# Patient Record
Sex: Female | Born: 1995 | Hispanic: No | Marital: Single | Smoking: Current some day smoker
Health system: Southern US, Community
[De-identification: ages and names within clinical notes are randomized; demographics above are authoritative.]

## PROBLEM LIST (undated history)

## (undated) HISTORY — PX: CHOLECYSTECTOMY: SHX55

---

## 2015-06-17 ENCOUNTER — Emergency Department (HOSPITAL_COMMUNITY): Payer: Medicaid Other

## 2015-06-17 ENCOUNTER — Inpatient Hospital Stay (HOSPITAL_COMMUNITY)
Admission: EM | Admit: 2015-06-17 | Discharge: 2015-06-20 | DRG: 392 | Disposition: A | Discharge status: Home / Self Care | Payer: Medicaid Other | Attending: Internal Medicine | Admitting: Internal Medicine

## 2015-06-17 ENCOUNTER — Encounter (HOSPITAL_COMMUNITY): Payer: Self-pay | Admitting: Emergency Medicine

## 2015-06-17 DIAGNOSIS — E876 Hypokalemia: Secondary | ICD-10-CM | POA: Diagnosis not present

## 2015-06-17 DIAGNOSIS — R109 Unspecified abdominal pain: Secondary | ICD-10-CM

## 2015-06-17 DIAGNOSIS — R7989 Other specified abnormal findings of blood chemistry: Secondary | ICD-10-CM | POA: Diagnosis present

## 2015-06-17 DIAGNOSIS — R05 Cough: Secondary | ICD-10-CM | POA: Diagnosis not present

## 2015-06-17 DIAGNOSIS — D72829 Elevated white blood cell count, unspecified: Secondary | ICD-10-CM | POA: Diagnosis present

## 2015-06-17 DIAGNOSIS — R1110 Vomiting, unspecified: Secondary | ICD-10-CM | POA: Diagnosis not present

## 2015-06-17 DIAGNOSIS — Z9049 Acquired absence of other specified parts of digestive tract: Secondary | ICD-10-CM

## 2015-06-17 DIAGNOSIS — R112 Nausea with vomiting, unspecified: Secondary | ICD-10-CM | POA: Diagnosis present

## 2015-06-17 DIAGNOSIS — E860 Dehydration: Secondary | ICD-10-CM | POA: Diagnosis present

## 2015-06-17 DIAGNOSIS — F17200 Nicotine dependence, unspecified, uncomplicated: Secondary | ICD-10-CM | POA: Diagnosis present

## 2015-06-17 DIAGNOSIS — R059 Cough, unspecified: Secondary | ICD-10-CM

## 2015-06-17 LAB — COMPREHENSIVE METABOLIC PANEL
ALT: 43 U/L (ref 14–54)
AST: 41 U/L (ref 15–41)
Albumin: 4.4 g/dL (ref 3.5–5.0)
Alkaline Phosphatase: 79 U/L (ref 38–126)
Anion gap: 12 (ref 5–15)
BUN: 35 mg/dL — AB (ref 6–20)
CHLORIDE: 104 mmol/L (ref 101–111)
CO2: 21 mmol/L — AB (ref 22–32)
CREATININE: 0.69 mg/dL (ref 0.44–1.00)
Calcium: 8.9 mg/dL (ref 8.9–10.3)
GFR calc Af Amer: >60 mL/min (ref >60)
GFR calc non Af Amer: >60 mL/min (ref >60)
GLUCOSE: 98 mg/dL (ref 65–99)
Potassium: 3 mmol/L — ABNORMAL LOW (ref 3.5–5.1)
SODIUM: 137 mmol/L (ref 135–145)
Total Bilirubin: 0.7 mg/dL (ref 0.3–1.2)
Total Protein: 8.5 g/dL — ABNORMAL HIGH (ref 6.5–8.1)

## 2015-06-17 LAB — URINALYSIS, ROUTINE W REFLEX MICROSCOPIC
BILIRUBIN URINE: NEGATIVE
GLUCOSE, UA: NEGATIVE mg/dL
Hgb urine dipstick: NEGATIVE
KETONES UR: 40 mg/dL — AB
Leukocytes, UA: NEGATIVE
Nitrite: NEGATIVE
PH: 6 (ref 5.0–8.0)
Protein, ur: NEGATIVE mg/dL
Specific Gravity, Urine: 1.02 (ref 1.005–1.030)
Urobilinogen, UA: 0.2 mg/dL (ref 0.0–1.0)

## 2015-06-17 LAB — RAPID URINE DRUG SCREEN, HOSP PERFORMED
AMPHETAMINES: NOT DETECTED
BENZODIAZEPINES: POSITIVE — AB
Barbiturates: NOT DETECTED
Cocaine: NOT DETECTED
OPIATES: NOT DETECTED
TETRAHYDROCANNABINOL: POSITIVE — AB

## 2015-06-17 LAB — CBC
HCT: 32 % — ABNORMAL LOW (ref 36.0–46.0)
Hemoglobin: 10.3 g/dL — ABNORMAL LOW (ref 12.0–15.0)
MCH: 28.5 pg (ref 26.0–34.0)
MCHC: 32.2 g/dL (ref 30.0–36.0)
MCV: 88.4 fL (ref 78.0–100.0)
PLATELETS: 455 10*3/uL — AB (ref 150–400)
RBC: 3.62 MIL/uL — ABNORMAL LOW (ref 3.87–5.11)
RDW: 16.4 % — AB (ref 11.5–15.5)
WBC: 10.6 10*3/uL — ABNORMAL HIGH (ref 4.0–10.5)

## 2015-06-17 LAB — LIPASE, BLOOD: LIPASE: 15 U/L (ref 11–51)

## 2015-06-17 LAB — I-STAT BETA HCG BLOOD, ED (MC, WL, AP ONLY): I-stat hCG, quantitative: <5 m[IU]/mL (ref <5)

## 2015-06-17 MED ORDER — PROMETHAZINE HCL 25 MG/ML IJ SOLN
6.2500 mg | Freq: Four times a day (QID) | INTRAMUSCULAR | Status: DC | PRN
  Administered 2015-06-17 – 2015-06-20 (×7): 6.25 mg via INTRAVENOUS
  Filled 2015-06-17 (×8): qty 1

## 2015-06-17 MED ORDER — HYDROMORPHONE HCL 1 MG/ML IJ SOLN
1.0000 mg | Freq: Once | INTRAMUSCULAR | Status: AC
  Administered 2015-06-17: 1 mg via INTRAVENOUS
  Filled 2015-06-17: qty 1

## 2015-06-17 MED ORDER — MORPHINE SULFATE (PF) 2 MG/ML IV SOLN
2.0000 mg | INTRAVENOUS | Status: AC | PRN
  Administered 2015-06-17 – 2015-06-18 (×2): 2 mg via INTRAVENOUS
  Filled 2015-06-17 (×2): qty 1

## 2015-06-17 MED ORDER — PROMETHAZINE HCL 25 MG/ML IJ SOLN: 6.2500 mg | Freq: Three times a day (TID) | INTRAMUSCULAR | Status: DC | PRN

## 2015-06-17 MED ORDER — SODIUM CHLORIDE 0.9 % IV BOLUS (SEPSIS)
1000.0000 mL | Freq: Once | INTRAVENOUS | Status: AC
  Administered 2015-06-17: 1000 mL via INTRAVENOUS

## 2015-06-17 MED ORDER — POTASSIUM CHLORIDE CRYS ER 20 MEQ PO TBCR
40.0000 meq | EXTENDED_RELEASE_TABLET | Freq: Once | ORAL | Status: DC
  Filled 2015-06-17: qty 2

## 2015-06-17 MED ORDER — IOHEXOL 300 MG/ML  SOLN
50.0000 mL | Freq: Once | INTRAMUSCULAR | Status: DC | PRN
  Administered 2015-06-17: 50 mL via ORAL

## 2015-06-17 MED ORDER — MORPHINE SULFATE (PF) 4 MG/ML IV SOLN
4.0000 mg | Freq: Once | INTRAVENOUS | Status: AC
  Administered 2015-06-17: 4 mg via INTRAVENOUS
  Filled 2015-06-17: qty 1

## 2015-06-17 MED ORDER — SODIUM CHLORIDE 0.9 % IV SOLN: INTRAVENOUS | Status: DC

## 2015-06-17 MED ORDER — PROMETHAZINE HCL 25 MG/ML IJ SOLN
25.0000 mg | Freq: Once | INTRAMUSCULAR | Status: AC
  Administered 2015-06-17: 25 mg via INTRAVENOUS
  Filled 2015-06-17: qty 1

## 2015-06-17 MED ORDER — FAMOTIDINE IN NACL 20-0.9 MG/50ML-% IV SOLN
20.0000 mg | Freq: Two times a day (BID) | INTRAVENOUS | Status: DC
  Administered 2015-06-17 – 2015-06-18 (×4): 20 mg via INTRAVENOUS
  Filled 2015-06-17 (×3): qty 50

## 2015-06-17 MED ORDER — IOHEXOL 300 MG/ML  SOLN
100.0000 mL | Freq: Once | INTRAMUSCULAR | Status: AC | PRN
  Administered 2015-06-17: 100 mL via INTRAVENOUS

## 2015-06-17 MED ORDER — ONDANSETRON HCL 4 MG/2ML IJ SOLN
4.0000 mg | Freq: Once | INTRAMUSCULAR | Status: AC
  Administered 2015-06-17: 4 mg via INTRAVENOUS
  Filled 2015-06-17: qty 2

## 2015-06-17 MED ORDER — FENTANYL CITRATE (PF) 100 MCG/2ML IJ SOLN
50.0000 ug | Freq: Once | INTRAMUSCULAR | Status: AC
  Administered 2015-06-17: 50 ug via INTRAVENOUS
  Filled 2015-06-17: qty 2

## 2015-06-17 MED ORDER — ENOXAPARIN SODIUM 40 MG/0.4ML ~~LOC~~ SOLN
40.0000 mg | SUBCUTANEOUS | Status: DC
  Administered 2015-06-17 – 2015-06-19 (×3): 40 mg via SUBCUTANEOUS
  Filled 2015-06-17 (×3): qty 0.4

## 2015-06-17 MED ORDER — POTASSIUM CHLORIDE IN NACL 20-0.9 MEQ/L-% IV SOLN
INTRAVENOUS | Status: DC
  Administered 2015-06-17 – 2015-06-19 (×4): via INTRAVENOUS
  Administered 2015-06-19: 1000 mL via INTRAVENOUS
  Filled 2015-06-17 (×9): qty 1000

## 2015-06-17 NOTE — ED Notes (Signed)
Bed: WA09 Expected date:  Expected time:  Means of arrival:  Comments: 19 yo F  N/V

## 2015-06-17 NOTE — ED Notes (Signed)
Brought in by EMS from home with c/o abdominal pain.  Pt reports that she has been having abdominal pain with N/V since 3 days ago, no diarrhea.  Pt was very "anxious" on EMS' arrival at the scene.  Was given Versed 2.5 mg IV en route.  Arrived to ED very drowsy/sleepy, responds to voice.

## 2015-06-17 NOTE — H&P (Signed)
History and Physical  Christine Hansen MRN:[id number] DOB: 07/17/1996 DOA: 06/17/2015  Referring physician: EDP PCP: PROVIDER NOT IN SYSTEM   Chief Complaint: n/v/ab pain  HPI: Christine Hansen is a 19 y.o. female   Who is from [place], [place] , here in town for a party this weekend, brought to [facility] ED by EMS due to n/v/abdominal pain, not able to keep any oral intake. She was given versed enroute due to "anxiety", in the ED, CT ab/pel no acute findings, UA no infection, negative pregnancy test, she was given ivf, due to not able to tolerate oral intake, hospitalist call to admit the patient. Patient denies sick contact, no fever, no chest pain, no sob.patient states this has been a chronic problems for the last two years, thought to be due to gallbladder issues but did not get better after cholecystectomy, recently had EGD by primary gastroenterologist.    Review of Systems:  Detail per HPI, Review of systems are otherwise negative  History reviewed. No pertinent past medical history. Past Surgical History  Procedure Laterality Date  . Cholecystectomy     Social History:  reports that she has been smoking.  She has never used smokeless tobacco. She reports that she drinks alcohol. She reports that she uses illicit drugs (Marijuana). Patient lives at home & is able to participate in activities of daily living independently   No Known Allergies  History reviewed. No pertinent family history.    Prior to Admission medications   Not on File    Physical Exam: BP 98/48 mmHg  Pulse 55  Temp(Src) 97.7 F (36.5 C) (Oral)  Resp 12  SpO2 100%  LMP 06/03/2015  General:  NAD Eyes: dry oral mucosa, PERRL ENT: unremarkable Neck: supple, no JVD Cardiovascular: RRR Respiratory: CTABL Abdomen: diffuse tender? But soft/ND, no guarding, no rebound, positive bowel sounds Skin: no rash Musculoskeletal:  No edema Psychiatric: calm/cooperative Neurologic: no focal findings            Labs on  Admission:  Basic Metabolic Panel:  Recent Labs Lab 06/17/15 0436  NA 137  K 3.0*  CL 104  CO2 21*  GLUCOSE 98  BUN 35*  CREATININE 0.69  CALCIUM 8.9   Liver Function Tests:  Recent Labs Lab 06/17/15 0436  AST 41  ALT 43  ALKPHOS 79  BILITOT 0.7  PROT 8.5*  ALBUMIN 4.4    Recent Labs Lab 06/17/15 0436  LIPASE 15   No results for input(s): AMMONIA in the last 168 hours. CBC:  Recent Labs Lab 06/17/15 0436  WBC 10.6*  HGB 10.3*  HCT 32.0*  MCV 88.4  PLT 455*   Cardiac Enzymes: No results for input(s): CKTOTAL, CKMB, CKMBINDEX, TROPONINI in the last 168 hours.  BNP (last 3 results) No results for input(s): BNP in the last 8760 hours.  ProBNP (last 3 results) No results for input(s): PROBNP in the last 8760 hours.  CBG: No results for input(s): GLUCAP in the last 168 hours.  Radiological Exams on Admission: Ct Abdomen Pelvis W Contrast  06/17/2015  CLINICAL DATA:  Diffuse abdominal pain, nausea and vomiting for 3 days. Negative serum pregnancy test. Prior cholecystectomy. EXAM: CT ABDOMEN AND PELVIS WITH CONTRAST TECHNIQUE: Multidetector CT imaging of the abdomen and pelvis was performed using the standard protocol following bolus administration of intravenous contrast. CONTRAST:  <[id number]<[id number]<[id number]<[id number]<[id number]<[id number] <[phone]<[phone]<[phone]<[phone]<MEASUREMENTA09.6La732 097 4[facility] [id number] IOHEXOL 300 MG/ML  SOLN COMPARISON:  None. FINDINGS: Lower chest: No significant pulmonary nodules or acute consolidative airspace disease. Hepatobiliary: Normal liver with no liver  mass. Status post cholecystectomy. No biliary ductal dilatation. Pancreas: Normal, with no mass or duct dilation. Spleen: Normal size. No mass. Adrenals/Urinary Tract: Normal adrenals. Normal kidneys with no hydronephrosis and no renal mass. Normal bladder. Stomach/Bowel: Grossly normal stomach. Normal caliber small bowel with no small bowel wall thickening. Normal appendix. Normal large bowel with no diverticulosis, large bowel wall thickening or pericolonic fat stranding.  Vascular/Lymphatic: Normal caliber abdominal aorta. Patent portal, splenic and renal veins. No pathologically enlarged lymph nodes in the abdomen or pelvis. Reproductive: Grossly normal anteverted uterus.  No adnexal mass. Other: No pneumoperitoneum, ascites or focal fluid collection. Musculoskeletal: No aggressive appearing focal osseous lesions. IMPRESSION: 1. No acute abnormality in the abdomen or pelvis. No evidence of bowel obstruction or acute bowel inflammation. Normal appendix. 2. Status post cholecystectomy.  No biliary ductal dilatation. Electronically Signed   By: Delbert Phenix M.D.   On: 06/17/2015 09:30      Assessment/Plan Present on Admission:  . Intractable vomiting . N&V (nausea and vomiting)  N/V/abdominal pain:  CT ab/pel no acute findings, urine pregnancy test negative, urine tox screen +THC. marijuana induced hyperemesis? Supportive care with ivf,  antiemetics and analgesics. Diet: Clears for now.  Hypokalemia: replace k, check mag.   Mild azotemia, mild leukocytosis: likely from dehydration, continue ivf.    DVT prophylaxis: lovenox  Consultants: none  Code Status: full   Family Communication:  Patient   Disposition Plan: admit to med surg observation, anticipate discharge in 24-48hrs if patient able to tolerate oral intake  Time spent:  Marquon Alcala MD, PhD [facility] Pager [phone] If 7PM-7AM, please contact night-coverage at [email or web address], password [facility]

## 2015-06-17 NOTE — ED Provider Notes (Signed)
CSN: [id number]     Arrival date & time 06/17/15  0407 History   First MD Initiated Contact with Patient 06/17/15 0701     Chief Complaint  Patient presents with  . Abdominal Pain     (Consider location/radiation/quality/duration/timing/severity/associated sxs/prior Treatment) HPI  19 year old female presents with nominal pain, nausea, and vomiting since 3 days ago. Denies diarrhea. Patient is asleep upon my arrival. She was given 2.5 mg IV Versed a couple hours ago because EMS states she was "very anxious" on scene. She has been having subjective fevers and chills. She states the abdominal pain is all over, cannot localize. Is also having mid back pain, also having a hard time localizing. Denies any urinary symptoms. Denies any blood in her vomit. States the pain is always there but anytime she throws up her abdominal pain worsens.   History reviewed. No pertinent past medical history. Past Surgical History  Procedure Laterality Date  . Cholecystectomy     History reviewed. No pertinent family history. Social History  Substance Use Topics  . Smoking status: Current Some Day Smoker  . Smokeless tobacco: Never Used  . Alcohol Use: Yes   OB History    No data available     Review of Systems  Constitutional: Positive for fever and chills.  Gastrointestinal: Positive for nausea, vomiting and abdominal pain. Negative for diarrhea and constipation.  Genitourinary: Negative for dysuria, vaginal bleeding, vaginal discharge and menstrual problem.  Musculoskeletal: Positive for back pain.  All other systems reviewed and are negative.     Allergies  Review of patient's allergies indicates no known allergies.  Home Medications   Prior to Admission medications   Not on File   BP 164/83 mmHg  Pulse 67  Temp(Src) 98.1 F (36.7 C) (Oral)  Resp 18  SpO2 100% Physical Exam  Constitutional: She is oriented to person, place, and time. She appears well-developed and well-nourished.  No distress.  HENT:  Head: Normocephalic and atraumatic.  Right Ear: External ear normal.  Left Ear: External ear normal.  Nose: Nose normal.  Eyes: Right eye exhibits no discharge. Left eye exhibits no discharge.  Cardiovascular: Normal rate, regular rhythm and normal heart sounds.   Pulmonary/Chest: Effort normal and breath sounds normal.  Abdominal: Soft. There is tenderness.  Tenderness diffusely during exam, exaggerated response to mild palpation. Abdomen soft, no rebound. No guarding. +CVA tenderness bilaterally  Neurological: She is alert and oriented to person, place, and time.  Skin: Skin is warm and dry. She is not diaphoretic.  Nursing note and vitals reviewed.   ED Course  Procedures (including critical care time) Labs Review Labs Reviewed  COMPREHENSIVE METABOLIC PANEL - Abnormal; Notable for the following:    Potassium 3.0 (*)    CO2 21 (*)    BUN 35 (*)    Total Protein 8.5 (*)    All other components within normal limits  CBC - Abnormal; Notable for the following:    WBC 10.6 (*)    RBC 3.62 (*)    Hemoglobin 10.3 (*)    HCT 32.0 (*)    RDW 16.4 (*)    Platelets 455 (*)    All other components within normal limits  LIPASE, BLOOD  URINALYSIS, ROUTINE W REFLEX MICROSCOPIC (NOT AT [facility])  I-STAT BETA HCG BLOOD, ED ([facility], [facility], [facility] ONLY)    Imaging Review Ct Abdomen Pelvis W Contrast  06/17/2015  CLINICAL DATA:  Diffuse abdominal pain, nausea and vomiting for 3 days. Negative serum pregnancy  test. Prior cholecystectomy. EXAM: CT ABDOMEN AND PELVIS WITH CONTRAST TECHNIQUE: Multidetector CT imaging of the abdomen and pelvis was performed using the standard protocol following bolus administration of intravenous contrast. CONTRAST:  100mL OMNIPAQUE IOHEXOL 300 MG/ML  SOLN COMPARISON:  None. FINDINGS: Lower chest: No significant pulmonary nodules or acute consolidative airspace disease. Hepatobiliary: Normal liver with no liver mass. Status post cholecystectomy. No biliary  ductal dilatation. Pancreas: Normal, with no mass or duct dilation. Spleen: Normal size. No mass. Adrenals/Urinary Tract: Normal adrenals. Normal kidneys with no hydronephrosis and no renal mass. Normal bladder. Stomach/Bowel: Grossly normal stomach. Normal caliber small bowel with no small bowel wall thickening. Normal appendix. Normal large bowel with no diverticulosis, large bowel wall thickening or pericolonic fat stranding. Vascular/Lymphatic: Normal caliber abdominal aorta. Patent portal, splenic and renal veins. No pathologically enlarged lymph nodes in the abdomen or pelvis. Reproductive: Grossly normal anteverted uterus.  No adnexal mass. Other: No pneumoperitoneum, ascites or focal fluid collection. Musculoskeletal: No aggressive appearing focal osseous lesions. IMPRESSION: 1. No acute abnormality in the abdomen or pelvis. No evidence of bowel obstruction or acute bowel inflammation. Normal appendix. 2. Status post cholecystectomy.  No biliary ductal dilatation. Electronically Signed   By: Delbert PhenixJason A Poff M.D.   On: 06/17/2015 09:30   I have personally reviewed and evaluated these images and lab results as part of my medical decision-making.   EKG Interpretation None      MDM   Final diagnoses:  None  Intractable Nausea and Vomiting  Patient with intractable nausea and vomiting. The abdominal pain is most likely related to the amount of vomiting, as there is no focal source on her CT scan. No GU or vaginal symptoms. Unable to control her emesis and she is unable to tolerate oral fluids and thus she will be admitted for observation for continued hydration and symptom control.    Pricilla LovelessScott Lulie Hurd, MD 06/18/15 [phone]

## 2015-06-18 ENCOUNTER — Observation Stay (HOSPITAL_COMMUNITY): Payer: Medicaid Other

## 2015-06-18 DIAGNOSIS — D72829 Elevated white blood cell count, unspecified: Secondary | ICD-10-CM | POA: Diagnosis present

## 2015-06-18 DIAGNOSIS — F17200 Nicotine dependence, unspecified, uncomplicated: Secondary | ICD-10-CM | POA: Diagnosis present

## 2015-06-18 DIAGNOSIS — R112 Nausea with vomiting, unspecified: Secondary | ICD-10-CM | POA: Diagnosis present

## 2015-06-18 DIAGNOSIS — Z9049 Acquired absence of other specified parts of digestive tract: Secondary | ICD-10-CM | POA: Diagnosis not present

## 2015-06-18 DIAGNOSIS — R05 Cough: Secondary | ICD-10-CM

## 2015-06-18 DIAGNOSIS — E876 Hypokalemia: Secondary | ICD-10-CM | POA: Diagnosis present

## 2015-06-18 DIAGNOSIS — E860 Dehydration: Secondary | ICD-10-CM | POA: Diagnosis present

## 2015-06-18 DIAGNOSIS — R109 Unspecified abdominal pain: Secondary | ICD-10-CM | POA: Diagnosis not present

## 2015-06-18 DIAGNOSIS — R1110 Vomiting, unspecified: Secondary | ICD-10-CM | POA: Diagnosis not present

## 2015-06-18 DIAGNOSIS — R7989 Other specified abnormal findings of blood chemistry: Secondary | ICD-10-CM | POA: Diagnosis present

## 2015-06-18 LAB — BASIC METABOLIC PANEL
Anion gap: 9 (ref 5–15)
BUN: 22 mg/dL — ABNORMAL HIGH (ref 6–20)
CHLORIDE: 109 mmol/L (ref 101–111)
CO2: 22 mmol/L (ref 22–32)
Calcium: 8.6 mg/dL — ABNORMAL LOW (ref 8.9–10.3)
Creatinine, Ser: 0.67 mg/dL (ref 0.44–1.00)
GFR calc non Af Amer: >60 mL/min (ref >60)
Glucose, Bld: 70 mg/dL (ref 65–99)
POTASSIUM: 3.3 mmol/L — AB (ref 3.5–5.1)
SODIUM: 140 mmol/L (ref 135–145)

## 2015-06-18 LAB — CBC
HCT: 31.1 % — ABNORMAL LOW (ref 36.0–46.0)
HEMOGLOBIN: 9.6 g/dL — AB (ref 12.0–15.0)
MCH: 28.9 pg (ref 26.0–34.0)
MCHC: 30.9 g/dL (ref 30.0–36.0)
MCV: 93.7 fL (ref 78.0–100.0)
Platelets: 395 10*3/uL (ref 150–400)
RBC: 3.32 MIL/uL — AB (ref 3.87–5.11)
RDW: 17 % — ABNORMAL HIGH (ref 11.5–15.5)
WBC: 12.3 10*3/uL — ABNORMAL HIGH (ref 4.0–10.5)

## 2015-06-18 LAB — INFLUENZA PANEL BY PCR (TYPE A & B)
H1N1FLUPCR: NOT DETECTED
INFLAPCR: NEGATIVE
Influenza B By PCR: NEGATIVE

## 2015-06-18 LAB — MAGNESIUM: MAGNESIUM: 1.8 mg/dL (ref 1.7–2.4)

## 2015-06-18 MED ORDER — BOOST / RESOURCE BREEZE PO LIQD
1.0000 | Freq: Two times a day (BID) | ORAL | Status: DC
  Administered 2015-06-18 – 2015-06-20 (×5): 1 via ORAL

## 2015-06-18 MED ORDER — MORPHINE SULFATE (PF) 2 MG/ML IV SOLN
2.0000 mg | INTRAVENOUS | Status: AC | PRN
  Administered 2015-06-18 – 2015-06-19 (×2): 2 mg via INTRAVENOUS
  Filled 2015-06-18 (×2): qty 1

## 2015-06-18 MED ORDER — PROMETHAZINE HCL 25 MG/ML IJ SOLN
12.5000 mg | Freq: Once | INTRAMUSCULAR | Status: AC
  Administered 2015-06-18: 12.5 mg via INTRAVENOUS
  Filled 2015-06-18: qty 1

## 2015-06-18 MED ORDER — ADULT MULTIVITAMIN W/MINERALS CH
1.0000 | ORAL_TABLET | Freq: Every day | ORAL | Status: DC
  Administered 2015-06-18 – 2015-06-20 (×3): 1 via ORAL
  Filled 2015-06-18 (×3): qty 1

## 2015-06-18 MED ORDER — GUAIFENESIN ER 600 MG PO TB12
600.0000 mg | ORAL_TABLET | Freq: Two times a day (BID) | ORAL | Status: DC
  Administered 2015-06-18 – 2015-06-20 (×5): 600 mg via ORAL
  Filled 2015-06-18 (×5): qty 1

## 2015-06-18 MED ORDER — HYDROCODONE-ACETAMINOPHEN 5-325 MG PO TABS
1.0000 | ORAL_TABLET | Freq: Three times a day (TID) | ORAL | Status: DC | PRN
  Administered 2015-06-18 – 2015-06-20 (×4): 1 via ORAL
  Filled 2015-06-18 (×5): qty 1

## 2015-06-18 MED ORDER — BENZONATATE 100 MG PO CAPS
100.0000 mg | ORAL_CAPSULE | Freq: Three times a day (TID) | ORAL | Status: DC | PRN
  Administered 2015-06-18: 100 mg via ORAL
  Filled 2015-06-18: qty 1

## 2015-06-18 NOTE — Progress Notes (Signed)
   PROGRESS NOTE  Hartford Polirin Bangerter WUJ:[id number] DOB: 07-11-1996 DOA: 06/17/2015 PCP: PROVIDER NOT IN SYSTEM  HPI/Recap of past 24 hours:  Coughing spells, reported no more vomiting,   Assessment/Plan: Active Problems:   Intractable vomiting   N&V (nausea and vomiting)  N/V/abdominal pain:  CT ab/pel no acute findings, urine pregnancy test negative, urine tox screen +THC. marijuana induced hyperemesis? Supportive care with ivf, antiemetics and analgesics. Advance diet as tolerated  Hypokalemia: replace k, check mag.   Mild azotemia, mild leukocytosis: likely from dehydration, continue ivf.  Cough, cxr pending, flu swab.  DVT prophylaxis: lovenox  Consultants: none  Code Status: full   Family Communication: Patient  Disposition Plan: home likely in 1-2 days   Consultants:  none  Procedures:  none  Antibiotics:  none   Objective: BP 92/44 mmHg  Pulse 60  Temp(Src) 98.1 F (36.7 C) (Oral)  Resp 14  SpO2 98%  LMP 06/03/2015  Intake/Output Summary (Last 24 hours) at 06/18/15 1003 Last data filed at 06/18/15 0600  Gross per 24 hour  Intake 1521.67 ml  Output      0 ml  Net 1521.67 ml   There were no vitals filed for this visit.  Exam:   General:  Does not look comfortable  Cardiovascular: RRR  Respiratory: CTABL  Abdomen: Soft/ND/NT, positive BS  Musculoskeletal: No Edema  Neuro: aaox3  Data Reviewed: Basic Metabolic Panel:  Recent Labs Lab 06/17/15 0436 06/18/15 0420  NA 137 140  K 3.0* 3.3*  CL 104 109  CO2 21* 22  GLUCOSE 98 70  BUN 35* 22*  CREATININE 0.69 0.67  CALCIUM 8.9 8.6*  MG  --  1.8   Liver Function Tests:  Recent Labs Lab 06/17/15 0436  AST 41  ALT 43  ALKPHOS 79  BILITOT 0.7  PROT 8.5*  ALBUMIN 4.4    Recent Labs Lab 06/17/15 0436  LIPASE 15   No results for input(s): AMMONIA in the last 168 hours. CBC:  Recent Labs Lab 06/17/15 0436 06/18/15 0420  WBC 10.6* 12.3*  HGB 10.3* 9.6*  HCT  32.0* 31.1*  MCV 88.4 93.7  PLT 455* 395   Cardiac Enzymes:   No results for input(s): CKTOTAL, CKMB, CKMBINDEX, TROPONINI in the last 168 hours. BNP (last 3 results) No results for input(s): BNP in the last 8760 hours.  ProBNP (last 3 results) No results for input(s): PROBNP in the last 8760 hours.  CBG: No results for input(s): GLUCAP in the last 168 hours.  No results found for this or any previous visit (from the past 240 hour(s)).   Studies: No results found.  Scheduled Meds: . enoxaparin (LOVENOX) injection  40 mg Subcutaneous Q24H  . famotidine (PEPCID) IV  20 mg Intravenous Q12H  . guaiFENesin  600 mg Oral BID  . potassium chloride SA  40 mEq Oral Once    Continuous Infusions: . 0.9 % NaCl with KCl 20 mEq / L 100 mL/hr at 06/18/15 0300     Time spent: 25mins  Jamie Hafford MD, PhD  [facility] Pager [phone]. If 7PM-7AM, please contact night-coverage at [email or web address], password [email or web address] 06/18/2015, 10:03 AM

## 2015-06-18 NOTE — Progress Notes (Signed)
Initial Nutrition Assessment  DOCUMENTATION CODES:   Not applicable  INTERVENTION:  - Please provide Boost Breeze BID (each supplement contains 250 kcal and 9 gm protein).  - Provide Multivitamin.  - RD team to continue to monitor for needs and diet advancement.    NUTRITION DIAGNOSIS:   Inadequate oral intake related to acute illness as evidenced by estimated needs.   GOAL:   Patient will meet greater than or equal to 90% of their needs   MONITOR:   PO intake, Supplement acceptance, Diet advancement, Labs, Weight trends  REASON FOR ASSESSMENT:   Malnutrition Screening Tool    ASSESSMENT:   19 [place] here in [place] for a party this weekend, [facility] due to n/v/abdominal pain, not able to keep any oral intake. She was given versed enroute due to "anxiety", in the ED, CT ab/pel no acute findings, UA no infection, negative pregnancy test, she was given ivf, due to not able to tolerate oral intake, hospitalist call to admit the patient.  Patient is coughing and curled up in bed during visit for MST. Patient reports positive for abdominal pain, and constipation. She denies nausea, and vomiting. Patient only able to provide minimal historical data during visit due to excessive coughing. Minimal anthropometric data available in chart, but working with nurse to update.   Patient's diet was recently advanced to a full liquid diet, but per nurse, the patient has not yet ordered anything.  She reports a low appetite for the past 3-4 days. Her last normal meal was last Wednesday. Patient noted that her clothes have started to fit differently, and they are too big to wear.   Patient lives with uncle and does most of the cooking at home. She reports a usual breakfast is cereal or a bagel. Not able to collect full diet history.   Comprehensive NFPE - not able to be performed at this visit, recommend completion at next visit. Mild fat wasting observed -  orbital. Mild muscle wasting - temple.   Labs reviewed: high WBC (12.3), low RBC (2.32), low Heme (9.6), low K (3.3), high BUN (22), low Ca (8.6)  Medications reviewed.    Diet Order:  Diet full liquid Room service appropriate?: Yes; Fluid consistency:: Thin  Skin:  Reviewed, no issues  Last BM:  10/29  Height:   Ht Readings from Last 1 Encounters:  06/18/15 5\' 4"  (1.626 m) (45 %*, Z = -0.11)   * Growth percentiles are based on CDC 2-20 Years data.    Weight:   Wt Readings from Last 1 Encounters:  No data found for Wt    Ideal Body Weight:  54.5 kg  BMI:  There is no weight on file to calculate BMI.  Estimated Nutritional Needs:   Kcal:  1300-1500 kcal  Protein:  60-70 gm  Fluid:  2 L/day  EDUCATION NEEDS:   Education needs no appropriate at this time  Delano MetzMaggie Hashim Eichhorst, Dietetic Intern 06/18/2015 1:52 PM

## 2015-06-19 LAB — BASIC METABOLIC PANEL
ANION GAP: 4 — AB (ref 5–15)
BUN: 9 mg/dL (ref 6–20)
CO2: 26 mmol/L (ref 22–32)
Calcium: 8.2 mg/dL — ABNORMAL LOW (ref 8.9–10.3)
Chloride: 108 mmol/L (ref 101–111)
Creatinine, Ser: 0.57 mg/dL (ref 0.44–1.00)
GFR calc Af Amer: >60 mL/min (ref >60)
GFR calc non Af Amer: >60 mL/min (ref >60)
GLUCOSE: 91 mg/dL (ref 65–99)
POTASSIUM: 2.9 mmol/L — AB (ref 3.5–5.1)
Sodium: 138 mmol/L (ref 135–145)

## 2015-06-19 LAB — CBC WITH DIFFERENTIAL/PLATELET
BASOS ABS: 0 10*3/uL (ref 0.0–0.1)
Basophils Relative: 0 %
Eosinophils Absolute: 0.1 10*3/uL (ref 0.0–0.7)
Eosinophils Relative: 1 %
HEMATOCRIT: 30.6 % — AB (ref 36.0–46.0)
Hemoglobin: 9.5 g/dL — ABNORMAL LOW (ref 12.0–15.0)
LYMPHS ABS: 3.5 10*3/uL (ref 0.7–4.0)
LYMPHS PCT: 46 %
MCH: 28.4 pg (ref 26.0–34.0)
MCHC: 31 g/dL (ref 30.0–36.0)
MCV: 91.6 fL (ref 78.0–100.0)
MONO ABS: 0.6 10*3/uL (ref 0.1–1.0)
Monocytes Relative: 8 %
NEUTROS ABS: 3.4 10*3/uL (ref 1.7–7.7)
Neutrophils Relative %: 45 %
Platelets: 308 10*3/uL (ref 150–400)
RBC: 3.34 MIL/uL — AB (ref 3.87–5.11)
RDW: 16.3 % — ABNORMAL HIGH (ref 11.5–15.5)
WBC: 7.5 10*3/uL (ref 4.0–10.5)

## 2015-06-19 LAB — MAGNESIUM: Magnesium: 1.8 mg/dL (ref 1.7–2.4)

## 2015-06-19 LAB — CORTISOL: Cortisol, Plasma: 4.5 ug/dL

## 2015-06-19 LAB — TSH: TSH: 1.02 u[IU]/mL (ref 0.350–4.500)

## 2015-06-19 MED ORDER — MAGNESIUM SULFATE 2 GM/50ML IV SOLN
2.0000 g | Freq: Once | INTRAVENOUS | Status: AC
  Administered 2015-06-19: 2 g via INTRAVENOUS
  Filled 2015-06-19: qty 50

## 2015-06-19 MED ORDER — POTASSIUM CHLORIDE CRYS ER 20 MEQ PO TBCR
40.0000 meq | EXTENDED_RELEASE_TABLET | ORAL | Status: AC
Start: 2015-06-19 — End: 2015-06-19
  Administered 2015-06-19 (×2): 40 meq via ORAL
  Filled 2015-06-19 (×2): qty 2

## 2015-06-19 MED ORDER — FAMOTIDINE 20 MG PO TABS
20.0000 mg | ORAL_TABLET | Freq: Two times a day (BID) | ORAL | Status: DC
  Administered 2015-06-19 – 2015-06-20 (×3): 20 mg via ORAL
  Filled 2015-06-19 (×4): qty 1

## 2015-06-19 NOTE — Progress Notes (Signed)
Key Points: Use following P&T approved IV to PO non-antibiotic change policy.  Description contains the criteria that are approved Note: Policy Excludes:  Esophagectomy patientsPHARMACIST - PHYSICIAN COMMUNICATION [facility] CONCERNING: IV to Oral Route Change Policy  RECOMMENDATION: This patient is receiving Pepcid by the intravenous route.  Based on criteria approved by the Pharmacy and Therapeutics Committee, the intravenous medication(s) is/are being converted to the equivalent oral dose form(s).   DESCRIPTION: These criteria include:  The patient is eating (either orally or via tube) and/or has been taking other orally administered medications for a least 24 hours  The patient has no evidence of active gastrointestinal bleeding or impaired GI absorption (gastrectomy, short bowel, patient on TNA or NPO).  If you have questions about this conversion, please contact the Pharmacy Department  []   [phone]( [phone] )  [facility] []   [phone]( [phone] )  [facility]  []   [phone]( [phone] )  [facility] [x]   [phone]( [phone] )  [facility]  Earl ManyLegge, Sakari Alkhatib South RoxanaMarshall, Richard L. Roudebush Va Medical CenterRPH 06/19/2015 8:14 AM

## 2015-06-19 NOTE — Progress Notes (Signed)
PROGRESS NOTE  Christine Hansen ZOX:[id number] DOB: 1995/11/17 DOA: 06/17/2015 PCP: PROVIDER NOT IN SYSTEM  HPI/Recap of past 24 hours:  Feeing better, want to advance diet  Assessment/Plan: Active Problems:   Intractable vomiting   N&V (nausea and vomiting)  N/V/abdominal pain:  CT ab/pel no acute findings, urine pregnancy test negative, urine tox screen +THC. marijuana induced hyperemesis? Supportive care with ivf, antiemetics and analgesics. Advance diet as tolerated Better, diet advance to soft, continue ivf, anticipate d/c tomorrow on 11/3.  Hypokalemia/hypomagnesemia: continue replace k/ mag.  Mild azotemia, mild leukocytosis: resolved with hydration. continue ivf.  Cough, cxr no acute findings, flu swab negative, mucinex and benzonatate prn  DVT prophylaxis: lovenox  Consultants: none  Code Status: full   Family Communication: Patient  Disposition Plan: home on 11/3   Consultants:  none  Procedures:  none  Antibiotics:  none   Objective: BP 91/45 mmHg  Pulse 67  Temp(Src) 97.3 F (36.3 C) (Oral)  Resp 16  Ht  (1.626 m)  SpO2 100%  LMP 06/17/2015  Intake/Output Summary (Last 24 hours) at 06/19/15 1425 Last data filed at 06/19/15 1407  Gross per 24 hour  Intake 2978.33 ml  Output      0 ml  Net 2978.33 ml   There were no vitals filed for this visit.  Exam:   General:  Better, not in pain, NAD  Cardiovascular: RRR  Respiratory: CTABL  Abdomen: Soft/ND/NT, positive BS  Musculoskeletal: No Edema  Neuro: aaox3  Data Reviewed: Basic Metabolic Panel:  Recent Labs Lab 06/17/15 0436 06/18/15 0420 06/19/15 0400  NA 137 140 138  K 3.0* 3.3* 2.9*  CL 104 109 108  CO2 21* 22 26  GLUCOSE 98 70 91  BUN 35* 22* 9  CREATININE 0.69 0.67 0.57  CALCIUM 8.9 8.6* 8.2*  MG  --  1.8 1.8   Liver Function Tests:  Recent Labs Lab 06/17/15 0436  AST 41  ALT 43  ALKPHOS 79  BILITOT 0.7  PROT 8.5*  ALBUMIN 4.4    Recent  Labs Lab 06/17/15 0436  LIPASE 15   No results for input(s): AMMONIA in the last 168 hours. CBC:  Recent Labs Lab 06/17/15 0436 06/18/15 0420 06/19/15 0400  WBC 10.6* 12.3* 7.5  NEUTROABS  --   --  3.4  HGB 10.3* 9.6* 9.5*  HCT 32.0* 31.1* 30.6*  MCV 88.4 93.7 91.6  PLT 455* 395 308   Cardiac Enzymes:   No results for input(s): CKTOTAL, CKMB, CKMBINDEX, TROPONINI in the last 168 hours. BNP (last 3 results) No results for input(s): BNP in the last 8760 hours.  ProBNP (last 3 results) No results for input(s): PROBNP in the last 8760 hours.  CBG: No results for input(s): GLUCAP in the last 168 hours.  No results found for this or any previous visit (from the past 240 hour(s)).   Studies: No results found.  Scheduled Meds: . enoxaparin (LOVENOX) injection  40 mg Subcutaneous Q24H  . famotidine  20 mg Oral BID  . feeding supplement  1 Container Oral BID BM  . guaiFENesin  600 mg Oral BID  . multivitamin with minerals  1 tablet Oral Daily  . potassium chloride SA  40 mEq Oral Once    Continuous Infusions: . 0.9 % NaCl with KCl 20 mEq / L 1,000 mL (06/19/15 1037)     Time spent:  Marycruz Boehner MD, PhD  [facility] Pager [phone]. If 7PM-7AM, please contact night-coverage at [email or web address], password  [email or web address] 06/19/2015, 2:25 PM  LOS: 1 day

## 2015-06-20 DIAGNOSIS — F1220 Cannabis dependence, uncomplicated: Secondary | ICD-10-CM

## 2015-06-20 DIAGNOSIS — R7989 Other specified abnormal findings of blood chemistry: Secondary | ICD-10-CM

## 2015-06-20 LAB — COMPREHENSIVE METABOLIC PANEL
ALBUMIN: 3.3 g/dL — AB (ref 3.5–5.0)
ALT: 21 U/L (ref 14–54)
ANION GAP: 6 (ref 5–15)
AST: 18 U/L (ref 15–41)
Alkaline Phosphatase: 62 U/L (ref 38–126)
BILIRUBIN TOTAL: 0.2 mg/dL — AB (ref 0.3–1.2)
BUN: 10 mg/dL (ref 6–20)
CO2: 24 mmol/L (ref 22–32)
Calcium: 8.8 mg/dL — ABNORMAL LOW (ref 8.9–10.3)
Chloride: 110 mmol/L (ref 101–111)
Creatinine, Ser: 0.53 mg/dL (ref 0.44–1.00)
GFR calc non Af Amer: >60 mL/min (ref >60)
GLUCOSE: 82 mg/dL (ref 65–99)
POTASSIUM: 3.8 mmol/L (ref 3.5–5.1)
SODIUM: 140 mmol/L (ref 135–145)
TOTAL PROTEIN: 6.2 g/dL — AB (ref 6.5–8.1)

## 2015-06-20 LAB — CBC
HEMATOCRIT: 29.1 % — AB (ref 36.0–46.0)
Hemoglobin: 9.1 g/dL — ABNORMAL LOW (ref 12.0–15.0)
MCH: 28.9 pg (ref 26.0–34.0)
MCHC: 31.3 g/dL (ref 30.0–36.0)
MCV: 92.4 fL (ref 78.0–100.0)
Platelets: 323 10*3/uL (ref 150–400)
RBC: 3.15 MIL/uL — ABNORMAL LOW (ref 3.87–5.11)
RDW: 16.3 % — AB (ref 11.5–15.5)
WBC: 8.5 10*3/uL (ref 4.0–10.5)

## 2015-06-20 LAB — MAGNESIUM: Magnesium: 1.9 mg/dL (ref 1.7–2.4)

## 2015-06-20 MED ORDER — HYDROCODONE-ACETAMINOPHEN 5-325 MG PO TABS: 2.0000 | ORAL_TABLET | Freq: Once | ORAL | Status: DC

## 2015-06-20 MED ORDER — HYOSCYAMINE SULFATE 0.125 MG SL SUBL
0.2500 mg | SUBLINGUAL_TABLET | Freq: Once | SUBLINGUAL | Status: DC
  Filled 2015-06-20: qty 2

## 2015-06-20 MED ORDER — KETOROLAC TROMETHAMINE 30 MG/ML IJ SOLN
30.0000 mg | Freq: Once | INTRAMUSCULAR | Status: AC
  Administered 2015-06-20: 30 mg via INTRAVENOUS
  Filled 2015-06-20: qty 1

## 2015-06-20 NOTE — Discharge Summary (Signed)
Discharge Summary  Christine Hansen UEA:[id number] DOB: 1996/03/31  PCP: PROVIDER NOT IN SYSTEM  Admit date: 06/17/2015 Discharge date: 06/20/2015  Time spent: <27mins  Recommendations for Outpatient Follow-up:  1. F/u with PMD and GI in [place], [place]  Discharge Diagnoses:  Active Hospital Problems   Diagnosis Date Noted  . Intractable vomiting 06/17/2015  . N&V (nausea and vomiting) 06/17/2015    Resolved Hospital Problems   Diagnosis Date Noted Date Resolved  No resolved problems to display.    Discharge Condition: stable  Diet recommendation: regular diet  Filed Weights   06/19/15 1438  Weight: 120 lb 12.8 oz (54.795 kg)    History of present illness:  Christine Hansen is a 19 y.o. female  Who is from [place], [place] , here in town for homecoming party this weekend, brought to [facility] ED by EMS due to n/v/abdominal pain, not able to keep any oral intake. She was given versed enroute due to "anxiety", in the ED, CT ab/pel no acute findings, UA no infection, negative pregnancy test, she was given ivf, due to not able to tolerate oral intake, hospitalist called to admit the patient. Patient denies sick contact, no fever, no chest pain, no sob.patient states this has been a chronic problems for the last two years, thought to be due to gallbladder issues but did not get better after cholecystectomy, recently had EGD by primary gastroenterologist.   Hospital Course:  Active Problems:   Intractable vomiting   N&V (nausea and vomiting)  N/V/abdominal pain:  CT ab/pel no acute findings, urine pregnancy test negative, urine tox screen +THC. marijuana induced hyperemesis? Supportive care with ivf, antiemetics and analgesics. Advance diet as tolerated Better, tolerating diet advancement, no n/v, no pain at discharge, patient is to f/u with pmd and GI in [place].  Hypokalemia/hypomagnesemia:  Replaced.  Mild azotemia, mild leukocytosis: resolved with hydration.   Cough, cxr no acute  findings, flu swab negative, mucinex and benzonatate prn, improved.    Consultants: none  Code Status: full   Family Communication: Patient and her girlfriend at bedside. Disposition Plan: home on 11/3   Consultants:  none  Procedures:  none  Antibiotics:  none  Discharge Exam: BP 104/72 mmHg  Pulse 49  Temp(Src) 97.9 F (36.6 C) (Oral)  Resp 16  Ht  (1.626 m)  Wt 120 lb 12.8 oz (54.795 kg)  BMI 20.73 kg/m2  SpO2 100%  LMP 06/17/2015   General: Better, not in pain, NAD, ambulating  Cardiovascular: RRR  Respiratory: CTABL  Abdomen: Soft/ND/NT, positive BS  Musculoskeletal: No Edema  Neuro: aaox3   Discharge Instructions You were cared for by a hospitalist during your hospital stay. If you have any questions about your discharge medications or the care you received while you were in the hospital after you are discharged, you can call the unit and asked to speak with the hospitalist on call if the hospitalist that took care of you is not available. Once you are discharged, your primary care physician will handle any further medical issues. Please note that NO REFILLS for any discharge medications will be authorized once you are discharged, as it is imperative that you return to your primary care physician (or establish a relationship with a primary care physician if you do not have one) for your aftercare needs so that they can reassess your need for medications and monitor your lab values.  Discharge Instructions    Diet - low sodium heart healthy    Complete by:  As  directed      Increase activity slowly    Complete by:  As directed             Medication List    Notice    You have not been prescribed any medications.     No Known Allergies     Follow-up Information    Follow up with pcp is [facility] [address] [phone]. Call on 06/17/2015.      Follow up with Medicaid patients .   Why:  [place] DSS [phone] Medicaid transportation in [place] is [phone]   Contact information:   As needed---As a medicaid patient it is your responsibility to contact DSS each time you move from county to county, state to state so that you may be reassigned doctors and get mail appropriately       The results of significant diagnostics from this hospitalization (including imaging, microbiology, ancillary and laboratory) are listed below for reference.    Significant Diagnostic Studies: Dg Chest 1 View  06/18/2015  CLINICAL DATA:  Cough, right rib pain, nausea/vomiting EXAM: CHEST 1 VIEW COMPARISON:  None. FINDINGS: Lungs are clear.  No pleural effusion or pneumothorax. The heart is normal in size. IMPRESSION: No evidence of acute cardiopulmonary disease. Electronically Signed   By: Charline Bills M.D.   On: 06/18/2015 12:31   Ct Abdomen Pelvis W Contrast  06/17/2015  CLINICAL DATA:  Diffuse abdominal pain, nausea and vomiting for 3 days. Negative serum pregnancy test. Prior cholecystectomy. EXAM: CT ABDOMEN AND PELVIS WITH CONTRAST TECHNIQUE: Multidetector CT imaging of the abdomen and pelvis was performed using the standard protocol following bolus administration of intravenous contrast. CONTRAST:  OMNIPAQUE IOHEXOL 300 MG/ML  SOLN COMPARISON:  None. FINDINGS: Lower chest: No significant pulmonary nodules or acute consolidative airspace disease. Hepatobiliary: Normal liver with no liver mass. Status post cholecystectomy. No biliary ductal dilatation. Pancreas: Normal, with no mass or duct dilation. Spleen: Normal size. No mass. Adrenals/Urinary Tract: Normal adrenals. Normal kidneys with no hydronephrosis and no renal mass. Normal bladder. Stomach/Bowel: Grossly normal stomach. Normal caliber small bowel with no small bowel wall thickening. Normal appendix. Normal large bowel with no diverticulosis, large bowel wall thickening or pericolonic fat stranding. Vascular/Lymphatic: Normal caliber abdominal  aorta. Patent portal, splenic and renal veins. No pathologically enlarged lymph nodes in the abdomen or pelvis. Reproductive: Grossly normal anteverted uterus.  No adnexal mass. Other: No pneumoperitoneum, ascites or focal fluid collection. Musculoskeletal: No aggressive appearing focal osseous lesions. IMPRESSION: 1. No acute abnormality in the abdomen or pelvis. No evidence of bowel obstruction or acute bowel inflammation. Normal appendix. 2. Status post cholecystectomy.  No biliary ductal dilatation. Electronically Signed   By: Delbert Phenix M.D.   On: 06/17/2015 09:30    Microbiology: No results found for this or any previous visit (from the past 240 hour(s)).   Labs: Basic Metabolic Panel:  Recent Labs Lab 06/17/15 0436 06/18/15 0420 06/19/15 0400 06/20/15 0430  NA 137 140 138 140  K 3.0* 3.3* 2.9* 3.8  CL 104 109 108 110  CO2 21* GLUCOSE 98 70 91 82  BUN 35* 22* 9 10  CREATININE 0.69 0.67 0.57 0.53  CALCIUM 8.9 8.6* 8.2* 8.8*  MG  --  1.8 1.8 1.9   Liver Function Tests:  Recent Labs Lab 06/17/15 0436 06/20/15 0430  AST 41 18  ALT 43 21  ALKPHOS 79 62  BILITOT 0.7 0.2*  PROT 8.5* 6.2*  ALBUMIN 4.4 3.3*    Recent Labs Lab 06/17/15 0436  LIPASE 15   No results for input(s): AMMONIA in the last 168 hours. CBC:  Recent Labs Lab 06/17/15 0436 06/18/15 0420 06/19/15 0400 06/20/15 0430  WBC 10.6* 12.3* 7.5 8.5  NEUTROABS  --   --  3.4  --   HGB 10.3* 9.6* 9.5* 9.1*  HCT 32.0* 31.1* 30.6* 29.1*  MCV 88.4 93.7 91.6 92.4  PLT 455* 395 308 323   Cardiac Enzymes: No results for input(s): CKTOTAL, CKMB, CKMBINDEX, TROPONINI in the last 168 hours. BNP: BNP (last 3 results) No results for input(s): BNP in the last 8760 hours.  ProBNP (last 3 results) No results for input(s): PROBNP in the last 8760 hours.  CBG: No results for input(s): GLUCAP in the last 168 hours.     SignedAlbertine Grates:  Aviela Blundell MD, PhD  [facility] 06/20/2015, 11:42  AM

## 2015-06-20 NOTE — Progress Notes (Signed)
Discharge instructions reviewed with pt. Pt is stable. No current questions or concerns. Will be leaving shortly  

## 2015-06-29 ENCOUNTER — Encounter (HOSPITAL_COMMUNITY): Payer: Self-pay

## 2015-06-29 ENCOUNTER — Emergency Department (HOSPITAL_COMMUNITY)
Admission: EM | Admit: 2015-06-29 | Discharge: 2015-06-29 | Disposition: A | Discharge status: Home / Self Care | Payer: MEDICAID | Attending: Emergency Medicine | Admitting: Emergency Medicine

## 2015-06-29 DIAGNOSIS — F1210 Cannabis abuse, uncomplicated: Secondary | ICD-10-CM | POA: Diagnosis not present

## 2015-06-29 DIAGNOSIS — Z720 Tobacco use: Secondary | ICD-10-CM | POA: Insufficient documentation

## 2015-06-29 DIAGNOSIS — Z3202 Encounter for pregnancy test, result negative: Secondary | ICD-10-CM | POA: Diagnosis not present

## 2015-06-29 DIAGNOSIS — R112 Nausea with vomiting, unspecified: Secondary | ICD-10-CM | POA: Diagnosis present

## 2015-06-29 LAB — RAPID URINE DRUG SCREEN, HOSP PERFORMED
AMPHETAMINES: NOT DETECTED
BENZODIAZEPINES: NOT DETECTED
Barbiturates: NOT DETECTED
Cocaine: NOT DETECTED
OPIATES: NOT DETECTED
Tetrahydrocannabinol: POSITIVE — AB

## 2015-06-29 LAB — CBG MONITORING, ED: GLUCOSE-CAPILLARY: 186 mg/dL — AB (ref 65–99)

## 2015-06-29 LAB — POC URINE PREG, ED: PREG TEST UR: NEGATIVE

## 2015-06-29 MED ORDER — METOCLOPRAMIDE HCL 5 MG/ML IJ SOLN
10.0000 mg | Freq: Once | INTRAMUSCULAR | Status: AC
  Administered 2015-06-29: 10 mg via INTRAVENOUS
  Filled 2015-06-29: qty 2

## 2015-06-29 MED ORDER — LORAZEPAM 2 MG/ML IJ SOLN
1.0000 mg | Freq: Once | INTRAMUSCULAR | Status: AC
  Administered 2015-06-29: 1 mg via INTRAVENOUS
  Filled 2015-06-29: qty 1

## 2015-06-29 MED ORDER — HALOPERIDOL LACTATE 5 MG/ML IJ SOLN
2.0000 mg | Freq: Once | INTRAMUSCULAR | Status: AC
  Administered 2015-06-29: 2 mg via INTRAVENOUS
  Filled 2015-06-29: qty 1

## 2015-06-29 MED ORDER — ONDANSETRON HCL 4 MG/2ML IJ SOLN
4.0000 mg | Freq: Once | INTRAMUSCULAR | Status: AC
  Administered 2015-06-29: 4 mg via INTRAVENOUS
  Filled 2015-06-29: qty 2

## 2015-06-29 MED ORDER — SODIUM CHLORIDE 0.9 % IV BOLUS (SEPSIS)
1000.0000 mL | Freq: Once | INTRAVENOUS | Status: AC
  Administered 2015-06-29: 1000 mL via INTRAVENOUS

## 2015-06-29 MED ORDER — PROMETHAZINE HCL 25 MG RE SUPP: 12.5000 mg | Freq: Four times a day (QID) | RECTAL | Status: AC | PRN

## 2015-06-29 MED ORDER — HALOPERIDOL LACTATE 5 MG/ML IJ SOLN: 2.0000 mg | Freq: Once | INTRAMUSCULAR | Status: DC

## 2015-06-29 MED ORDER — KETOROLAC TROMETHAMINE 30 MG/ML IJ SOLN
30.0000 mg | Freq: Once | INTRAMUSCULAR | Status: AC
  Administered 2015-06-29: 30 mg via INTRAVENOUS
  Filled 2015-06-29: qty 1

## 2015-06-29 NOTE — ED Notes (Signed)
Pt given crackers and sprite for PO challenge. Pt refusing to talk with nurse, rolling back and forth in bed and not opening eyes.

## 2015-06-29 NOTE — ED Notes (Signed)
Pt arrives this evening with extreme nausea. States shes been "throwing up all day." Admits to smoking weed. Denies any other drugs. Pt tachypnic, states "I can't feel my legs." Pt not answering any questions at this time.

## 2015-06-29 NOTE — ED Notes (Signed)
Dr. Campos at the bedside.  

## 2015-06-29 NOTE — Discharge Instructions (Signed)
Cannabis Use Disorder Cannabis use disorder is a mental disorder. It is not one-time or occasional use of cannabis, more commonly known as marijuana. Cannabis use disorder is the continued, nonmedical use of cannabis that interferes with normal life activities or causes health problems. People with cannabis use disorder get a feeling of extreme pleasure and relaxation from cannabis use. This "high" is very rewarding and causes people to use over and over.  The mind-altering ingredient in cannabis is know as THC. THC can also interfere with motor coordination, memory, judgment, and accurate sense of space and time. These effects can last for a few days after using cannabis. Regular heavy cannabis use can cause long-lasting problems with thinking and learning. In young people, these problems may be permanent. Cannabis sometimes causes severe anxiety, paranoia, or visual hallucinations. Man-made (synthetic) cannabis-like drugs, such as "spice" and "K2," cause the same effects as THC but are much stronger. Cannabis-like drugs can cause dangerously high blood pressure and heart rate.  Cannabis use disorder usually starts in the teenage years. It can trigger the development of schizophrenia. It is somewhat more common in men than women. People who have family members with the disorder or existing mental health issues such as depression and posttraumatic stress disorderare more likely to develop cannabis use disorder. People with cannabis use disorder are at higher risk for use of other drugs of abuse.  SIGNS AND SYMPTOMS Signs and symptoms of cannabis use disorder include:   Use of cannabis in larger amounts or over a longer period than intended.   Unsuccessful attempts to cut down or control cannabis use.   A lot of time spent obtaining, using, or recovering from the effects of cannabis.   A strong desire or urge to use cannabis (cravings).   Continued use of cannabis in spite of problems at work,  school, or home because of use.   Continued use of cannabis in spite of relationship problems because of use.  Giving up or cutting down on important life activities because of cannabis use.  Use of cannabis over and over even in situations when it is physically hazardous, such as when driving a car.   Continued use of cannabis in spite of a physical problem that is likely related to use. Physical problems can include:  Chronic cough.  Bronchitis.  Emphysema.  Throat and lung cancer.  Continued use of cannabis in spite of a mental problem that is likely related to use. Mental problems can include:  Psychosis.  Anxiety.  Difficulty sleeping.  Need to use more and more cannabis to get the same effect, or lessened effect over time with use of the same amount (tolerance).  Having withdrawal symptoms when cannabis use is stopped, or using cannabis to reduce or avoid withdrawal symptoms. Withdrawal symptoms include:  Irritability or anger.  Anxiety or restlessness.  Difficulty sleeping.  Loss of appetite or weight.  Aches and pains.  Shakiness.  Sweating.  Chills. DIAGNOSIS Cannabis use disorder is diagnosed by your health care provider. You may be asked questions about your cannabis use and how it affects your life. A physical exam may be done. A drug screen may be done. You may be referred to a mental health professional. The diagnosis of cannabis use disorder requires at least two symptoms within 12 months. The type of cannabis use disorder you have depends on the number of symptoms you have. The type may be:  Mild. Two or three signs and symptoms.   Moderate. Four or   five signs and symptoms.   Severe. Six or more signs and symptoms.  TREATMENT Treatment is usually provided by mental health professionals with training in substance use disorders. The following options are available:  Counseling or talk therapy. Talk therapy addresses the reasons you use  cannabis. It also addresses ways to keep you from using again. The goals of talk therapy include:  Identifying and avoiding triggers for use.  Learning how to handle cravings.  Replacing use with healthy activities.  Support groups. Support groups provide emotional support, advice, and guidance.  Medicine. Medicine is used to treat mental health issues that trigger cannabis use or that result from it. HOME CARE INSTRUCTIONS  Take medicines only as directed by your health care provider.  Check with your health care provider before starting any new medicines.  Keep all follow-up visits as directed by your health care provider. SEEK MEDICAL CARE IF:  You are not able to take your medicines as directed.  Your symptoms get worse. SEEK IMMEDIATE MEDICAL CARE IF: You have serious thoughts about hurting yourself or others. FOR MORE INFORMATION  National Institute on Drug Abuse: www.drugabuse.gov  Substance Abuse and Mental Health Services Administration: www.samhsa.gov   This information is not intended to replace advice given to you by your health care provider. Make sure you discuss any questions you have with your health care provider.   Document Released: 07/31/2000 Document Revised: 08/24/2014 Document Reviewed: 08/16/2013 Elsevier Interactive Patient Education 2016 Elsevier Inc.  

## 2015-06-29 NOTE — ED Provider Notes (Signed)
CSN: [id number]     Arrival date & time 06/29/15  0243 History  By signing my name below, I, Christine Hansen, attest that this documentation has been prepared under the direction and in the presence of Ekin Pilar, MD. Electronically Signed: Angelene Giovanni, ED Scribe. 06/29/2015. 3:13 AM.    Chief Complaint  Patient presents with  . Nausea   Patient is a 19 y.o. female presenting with vomiting. The history is provided by the patient and medical records. History limited by: level 5 caveat due to uncooperativeness. No language interpreter was used.  Emesis Timing:  Intermittent Progression:  Unchanged Chronicity:  Recurrent Recent urination:  Normal Associated symptoms: abdominal pain   Associated symptoms: no chills and no diarrhea   Risk factors: no alcohol use   Risk factors comment:  Chronic marijuan usage  HPI Comments: Christine Hansen is a 19 y.o. female who presents to the Emergency Department complaining of multiple episodes of nausea and vomiting onset this am. She reports associated gradually worsening constant abdominal cramping. She denies any diarrhea. Pt reports that she smoked marijuana this am prior to onset. Pt denies any other illicit drug use. Pt currently uncooperative.    History reviewed. No pertinent past medical history. Past Surgical History  Procedure Laterality Date  . Cholecystectomy     History reviewed. No pertinent family history. Social History  Substance Use Topics  . Smoking status: Current Some Day Smoker  . Smokeless tobacco: Never Used  . Alcohol Use: Yes   OB History    No data available     Review of Systems  Constitutional: Negative for fever and chills.  Gastrointestinal: Positive for nausea, vomiting and abdominal pain. Negative for diarrhea.  All other systems reviewed and are negative.     Allergies  Review of patient's allergies indicates no known allergies.  Home Medications   Prior to Admission medications   Not on  File   BP 143/87 mmHg  Pulse 115  Temp(Src) 97.5 F (36.4 C) (Oral)  SpO2 100%  LMP 06/17/2015 Physical Exam  Constitutional: She is oriented to person, place, and time. She appears well-developed and well-nourished. No distress.  HENT:  Head: Normocephalic and atraumatic.  Mouth/Throat: Oropharynx is clear and moist. No oropharyngeal exudate.  Eyes: Conjunctivae and EOM are normal.  Initially refused to open eyes  Neck: Normal range of motion. Neck supple. No tracheal deviation present.  Cardiovascular: Normal rate.   Pulmonary/Chest: Effort normal. No respiratory distress.  Abdominal: Soft. Bowel sounds are normal. She exhibits no distension. There is no tenderness. There is no rebound and no guarding.  Musculoskeletal: Normal range of motion. She exhibits no edema or tenderness.  Neurological: She is alert and oriented to person, place, and time. She has normal reflexes.  Skin: Skin is warm and dry.  Psychiatric: Her affect is labile. She is agitated.  Pt currently uncooperative   Nursing note and vitals reviewed.   ED Course  Procedures (including critical care time) DIAGNOSTIC STUDIES: Oxygen Saturation is 100% on RA, normal by my interpretation.    COORDINATION OF CARE: 3:07 AM- Pt advised of plan for treatment and pt agrees. Pt will receive fluids and medication.    Labs Review Labs Reviewed  CBG MONITORING, ED - Abnormal; Notable for the following:    Glucose-Capillary 186 (*)    All other components within normal limits  URINE RAPID DRUG SCREEN, HOSP PERFORMED  POC URINE PREG, ED    Imaging Review No results found.   Ishani Goldwasser  Xadrian Craighead, MD has personally reviewed and evaluated these images and lab results as part of her medical decision-making.   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  Patient states this helps her chronic pain and vomiting and does not induce vomiting.  She does not want to speak.  Exam is not consistent with dehydration/ no signs of surgical abdomen. No indication for imaging at this time and she intermittently sleeps and stops vomiting.  She is advised to stop using marijuana.  Stable for discharge at this time     I personally performed the services described in this documentation, which was scribed in my presence. The recorded information has been reviewed and is accurate.     Cy BlamerApril Han Vejar, MD 06/29/15 [phone]

## 2016-03-23 IMAGING — DX DG CHEST 1V
1 series · 1 of 1 positions shown · non-contrast
Comparison: None.

CLINICAL DATA: Cough, right rib pain, nausea/vomiting

EXAM:
CHEST 1 VIEW

[chest ap]
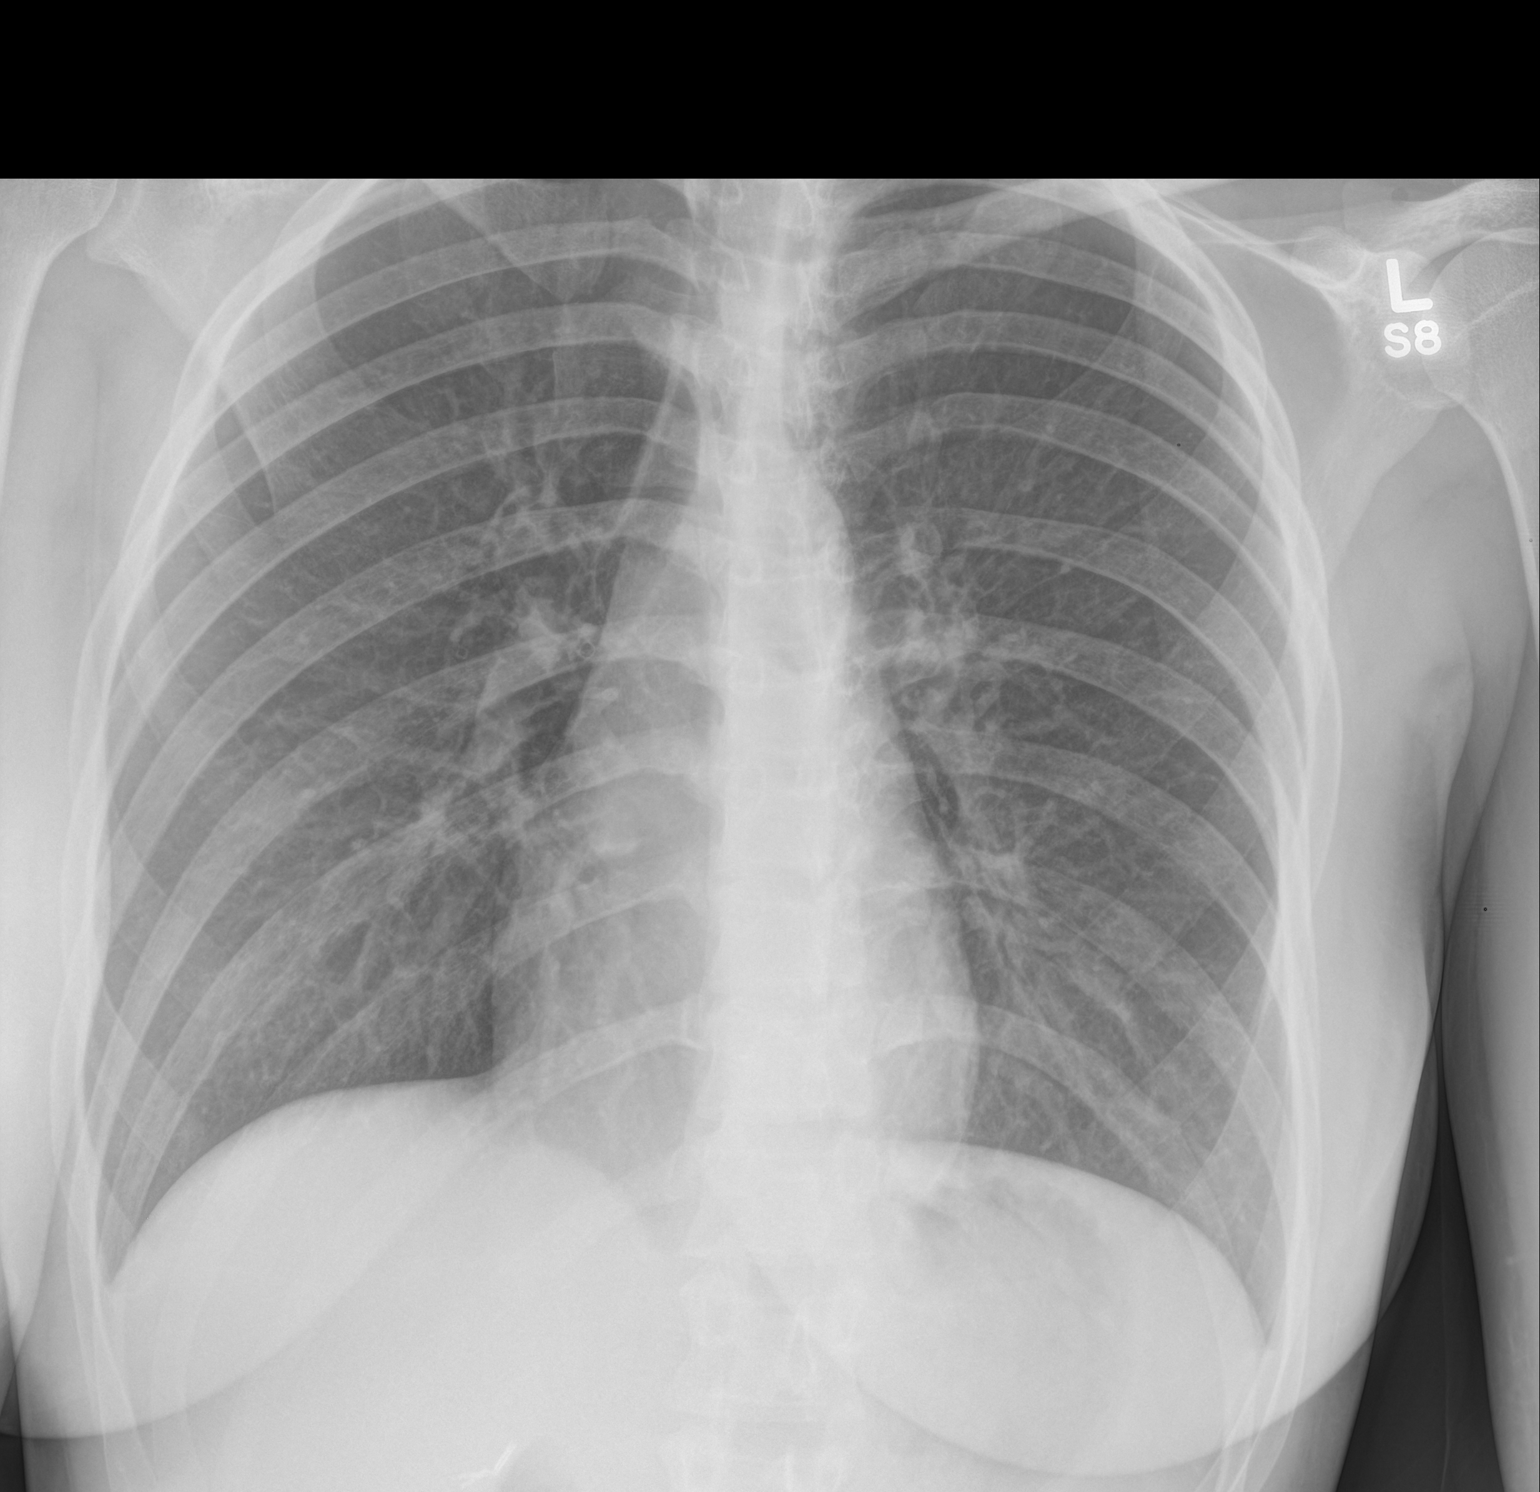

[1 of 1 positions shown; findings below may reference images not displayed]

FINDINGS: Lungs are clear.  No pleural effusion or pneumothorax.

The heart is normal in size.
IMPRESSION: No evidence of acute cardiopulmonary disease.
